# Patient Record
Sex: Male | Born: 1990 | Race: White | Hispanic: No | Marital: Single | Smoking: Never smoker
Health system: Southern US, Community
[De-identification: ages and names within clinical notes are randomized; demographics above are authoritative.]

## PROBLEM LIST (undated history)

## (undated) DIAGNOSIS — F419 Anxiety disorder, unspecified: Secondary | ICD-10-CM

## (undated) DIAGNOSIS — F909 Attention-deficit hyperactivity disorder, unspecified type: Secondary | ICD-10-CM

## (undated) HISTORY — DX: Attention-deficit hyperactivity disorder, unspecified type: F90.9

---
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
## 2016-01-31 DIAGNOSIS — M25512 Pain in left shoulder: Secondary | ICD-10-CM | POA: Diagnosis not present

## 2017-01-16 DIAGNOSIS — L309 Dermatitis, unspecified: Secondary | ICD-10-CM | POA: Diagnosis not present

## 2017-01-18 DIAGNOSIS — L309 Dermatitis, unspecified: Secondary | ICD-10-CM | POA: Diagnosis not present

## 2017-01-30 DIAGNOSIS — L309 Dermatitis, unspecified: Secondary | ICD-10-CM | POA: Diagnosis not present

## 2017-02-05 DIAGNOSIS — D2261 Melanocytic nevi of right upper limb, including shoulder: Secondary | ICD-10-CM | POA: Diagnosis not present

## 2017-02-05 DIAGNOSIS — D2262 Melanocytic nevi of left upper limb, including shoulder: Secondary | ICD-10-CM | POA: Diagnosis not present

## 2017-05-14 DIAGNOSIS — J302 Other seasonal allergic rhinitis: Secondary | ICD-10-CM | POA: Diagnosis not present

## 2017-07-19 ENCOUNTER — Ambulatory Visit
Admission: RE | Admit: 2017-07-19 | Discharge: 2017-07-19 | Disposition: A | Discharge status: Home / Self Care | Payer: Worker's Compensation | Source: Ambulatory Visit | Attending: Nurse Practitioner | Admitting: Nurse Practitioner

## 2017-07-19 ENCOUNTER — Other Ambulatory Visit: Payer: Self-pay | Admitting: Nurse Practitioner

## 2017-07-19 DIAGNOSIS — M545 Low back pain, unspecified: Secondary | ICD-10-CM

## 2017-10-19 DIAGNOSIS — R05 Cough: Secondary | ICD-10-CM | POA: Diagnosis not present

## 2020-05-21 ENCOUNTER — Ambulatory Visit: Payer: Self-pay

## 2020-05-21 ENCOUNTER — Encounter (INDEPENDENT_AMBULATORY_CARE_PROVIDER_SITE_OTHER): Payer: Self-pay

## 2020-05-21 ENCOUNTER — Ambulatory Visit: Payer: 59 | Admitting: Internal Medicine

## 2020-05-21 ENCOUNTER — Encounter: Payer: Self-pay | Admitting: Internal Medicine

## 2020-05-21 ENCOUNTER — Other Ambulatory Visit: Payer: Self-pay

## 2020-05-21 VITALS — BP 130/85 | HR 120 | Ht 67.5 in | Wt 151.0 lb

## 2020-05-21 DIAGNOSIS — G8929 Other chronic pain: Secondary | ICD-10-CM

## 2020-05-21 DIAGNOSIS — M2550 Pain in unspecified joint: Secondary | ICD-10-CM | POA: Diagnosis not present

## 2020-05-21 DIAGNOSIS — R7982 Elevated C-reactive protein (CRP): Secondary | ICD-10-CM | POA: Diagnosis not present

## 2020-05-21 DIAGNOSIS — M79642 Pain in left hand: Secondary | ICD-10-CM | POA: Diagnosis not present

## 2020-05-21 DIAGNOSIS — M5459 Other low back pain: Secondary | ICD-10-CM

## 2020-05-21 DIAGNOSIS — M545 Low back pain, unspecified: Secondary | ICD-10-CM | POA: Insufficient documentation

## 2020-05-21 NOTE — Progress Notes (Signed)
Office Visit Note  Patient: Marcus Cox             Date of Birth: 04/23/1990           MRN: [id number]             PCP: Dorena Cookey, MD (Inactive) Referring: Carolee Rota, NP Visit Date: 05/21/2020 Occupation: Road Production designer, theatre/television/film work  Subjective:  New Patient (Initial Visit) (Patient complains of generalized all over pain and weakness. )  History of Present Illness: RACIEL Cox is a 30 y.o. male here for evaluation of joint pains and elevated CRP level also concern for fibromyalgia. He has chronic pain diffusely and severe fatigue and feeling of general weakness since around his senior year of highschool. He does not recall any preceding medical events or changes or any joint injuries associated with this.  He does not typically notice any specific or focal joint swelling or inflammation changes but usually more generalized symptoms.  He does have an area on the right low back where he often has tenderness and chronic pain that is frequently worse when lying flat or rising from a prolonged seated position.  He has also experienced some pain in the heels and describes at least one episode of radiation of symptoms of the whole right leg.  He had previous x-ray of the lumbar spine in 2019 that was unrevealing.  He occasionally uses over-the-counter Tylenol or ibuprofen but no long-term treatment for symptoms.  Labs reviewed ANA neg ESR 4 CRP 33  Activities of Daily Living:  Patient reports morning stiffness for 24 hours.   Patient Reports nocturnal pain.  Difficulty dressing/grooming: Denies Difficulty climbing stairs: Reports Difficulty getting out of chair: Reports Difficulty using hands for taps, buttons, cutlery, and/or writing: Denies  Review of Systems  Constitutional: Positive for fatigue.  HENT: Positive for nose dryness. Negative for mouth sores and mouth dryness.   Eyes: Positive for pain, itching and visual disturbance. Negative for dryness.   Respiratory: Positive for shortness of breath and difficulty breathing. Negative for cough and hemoptysis.   Cardiovascular: Negative for chest pain, palpitations and swelling in legs/feet.  Gastrointestinal: Negative for abdominal pain, blood in stool, constipation and diarrhea.  Endocrine: Negative for increased urination.  Genitourinary: Negative for painful urination.  Musculoskeletal: Positive for arthralgias, joint pain, myalgias, muscle weakness, morning stiffness and myalgias. Negative for joint swelling and muscle tenderness.  Skin: Negative for color change, rash and redness.  Allergic/Immunologic: Negative for susceptible to infections.  Neurological: Positive for headaches, memory loss and weakness. Negative for dizziness and numbness.  Hematological: Negative for swollen glands.  Psychiatric/Behavioral: Positive for confusion and sleep disturbance.    PMFS History:  Patient Active Problem List   Diagnosis Date Noted  . Elevated C-reactive protein (CRP) 05/21/2020  . Chronic low back pain 05/21/2020  . Polyarthralgia 05/21/2020  . ATTENTION DEFICIT HYPERACTIVITY DISORDER 01/09/2008  . TONSILLITIS, ACUTE 01/09/2008    Past Medical History:  Diagnosis Date  . ADHD     History reviewed. No pertinent family history. History reviewed. No pertinent surgical history. Social History   Social History Narrative  . Not on file    There is no immunization history on file for this patient.   Objective: Vital Signs: BP 130/85 (BP Location: Right Arm, Patient Position: Sitting, Cuff Size: Normal)   Pulse (!) 120   Ht 5' 7.5" (1.715 m)   Wt 151 lb (68.5 kg)   BMI 23.30 kg/m  Physical Exam HENT:     Right Ear: External ear normal.     Left Ear: External ear normal.     Mouth/Throat:     Mouth: Mucous membranes are moist.     Pharynx: Oropharynx is clear.  Eyes:     Conjunctiva/sclera: Conjunctivae normal.  Cardiovascular:     Rate and Rhythm: Regular rhythm.  Tachycardia present.  Pulmonary:     Effort: Pulmonary effort is normal.     Breath sounds: Normal breath sounds.  Skin:    General: Skin is warm and dry.     Findings: No rash.  Neurological:     General: No focal deficit present.     Mental Status: He is alert.  Psychiatric:        Mood and Affect: Mood normal.    Musculoskeletal Exam:  Neck full ROM no tenderness Shoulders full ROM no tenderness or swelling Elbows full ROM no tenderness or swelling Wrists no tenderness or swelling, left wrist slightly decreased radial deviation Fingers full ROM no tenderness or swelling Right sided paraspinal tenderness to palpation around iliac crest Hip normal internal and external rotation, right sided posterior hip and back pain with Pace maneuver, left normal Knees full ROM no tenderness or swelling Ankles full ROM no tenderness or swelling MTPs full ROM no tenderness or swelling   Investigation: No additional findings.  Imaging: XR Hand 2 View Left  Result Date: 05/21/2020 X-ray left hand 2 views Radiocarpal joint space appears normal.  Carpal bones appear normal.  MCP PIP and DIP joints appear normal.  Bone mineralization is normal. Impression Normal appearing hand x-ray  XR Pelvis 1-2 Views  Result Date: 05/21/2020 X-ray pelvis 2 views AP and Ferguson Femoral acetabular joint spaces appear normal bilaterally.  Probable cystic change at the femoral neck.  SI joints are patent bilaterally with no erosions or significant sclerotic changes.  Visualized portion of the lumbar spine appears normal except some mild dextroscoliosis. Impression Normal pelvis xray without significant changes   Recent Labs: No results found for: WBC, HGB, PLT, NA, K, CL, CO2, GLUCOSE, BUN, CREATININE, BILITOT, ALKPHOS, AST, ALT, PROT, ALBUMIN, CALCIUM, GFRAA, QFTBGOLD, QFTBGOLDPLUS  Speciality Comments: No specialty comments available.  Procedures:  No procedures performed Allergies: Patient has no known  allergies.   Assessment / Plan:     Visit Diagnoses: Polyarthralgia - Plan: HLA-B27 antigen, C-reactive protein, Rheumatoid factor, Cyclic citrul peptide antibody, IgG  He describes pretty diffuse pains not very well localized over specific joints this could be consistent with fibromyalgia or chronic pain syndrome.  However he does describe pretty consistent low back pain problems.  Also with a significantly elevated CRP not otherwise explained at this time.  We will repeat inflammatory markers also checking RF, CCP, HLA-B27 test.  I believe we would also need to exclude axial spondyloarthropathy before attributing all of this problem to fibromyalgia, chronic pain sensitization, or myofascial problems.  Elevated C-reactive protein (CRP)  No obvious inflammatory changes are present on exam today.  Repeating inflammatory markers today.  Chronic right-sided low back pain, unspecified whether sciatica present  He has chronic low back pain and at least one episode of what could be radicular symptoms in the right leg.  However overall from his history is most want to exclude any inflammatory sacroiliitis type changes.  Obtaining x-ray pelvis and left hand today.  If these are nonspecific but still has evidence of inflammatory process would recommend MRI evaluation.   Orders: Orders Placed This Encounter  Procedures  . XR Pelvis 1-2 Views  . XR Hand 2 View Left  . HLA-B27 antigen  . C-reactive protein  . Rheumatoid factor  . Cyclic citrul peptide antibody, IgG   No orders of the defined types were placed in this encounter.    Follow-Up Instructions: No follow-ups on file.   Collier Salina, MD  Note - This record has been created using [employer].  Chart creation errors have been sought, but may not always  have been located. Such creation errors do not reflect on  the standard of medical care.

## 2020-05-21 NOTE — Patient Instructions (Addendum)
I recommend checking out the [facility] patient-centered guide for fibromyalgia and chronic pain management: [employer]   C-Reactive Protein Test Why am I having this test? The C-reactive protein (CRP) is a substance that the liver releases in response to inflammation within the body. You may have a CRP test to help diagnose:  Serious bacterial or fungal infections.  Inflammatory diseases, such as inflammatory bowel disease.  Lupus, rheumatoid arthritis, or other autoimmune diseases. What is being tested? This test checks for the level of CRP in your blood. The level of CRP in your body increases greatly after a heart attack, infection, or injury. What kind of sample is taken? A blood sample is required for this test. It is usually collected by inserting a needle into a blood vessel.   Tell a health care provider about:  All medicines you are taking, including vitamins, herbs, eye drops, creams, and over-the-counter medicines.  Any medical conditions you have.  The use of birth control pills or hormone replacement therapy such as estrogen.  Any blood disorders you have.  Whether you are pregnant or may be pregnant. How are the results reported? Your test results will be reported as a value that indicates how much CRP is in your blood. This will be reported as milligrams of CRP per liter (mg/L) of blood. Your health care provider will compare your results to normal ranges that were established after testing a large group of people (reference ranges). Reference ranges may vary among labs and hospitals. For this test, the standard CRP reference value is less than 10 mg/L. What do the results mean? A standard CRP test result that is less than 10 mg/L is considered normal, meaning that you do not have an abnormally high level of inflammation in your body. A standard CRP test result that is greater than 10 mg/L means that there is an abnormally high level of  inflammation in your body that is causing CRP to be released. Inflammation may result from many different conditions or injuries. You will have more tests to help make a diagnosis. Talk with your health care provider about what your results mean. Questions to ask your health care provider Ask your health care provider, or the department that is doing the test:  When will my results be ready?  How will I get my results?  What are my treatment options?  What other tests do I need?  What are my next steps? Summary  C-reactive protein (CPR) is a substance released by the liver in response to inflammation within the body.  The CRP test may be performed to help diagnose infection and inflammatory conditions.  Talk with your health care provider about what your results mean.   Rheumatoid Factor Test Why am I having this test? The rheumatoid factor test is used to help diagnose certain autoimmune diseases. Normally, your body makes protective proteins called antibodies (IgM, IgG, and IgA) to help fight off infections. If you have an autoimmune disease, your body may make a collection of antibodies that do not function correctly (autoantibodies). They attack tissues that are wrongly identified as foreign. In some autoimmune diseases, these autoantibodies are known as the rheumatoid factor (RF). You may have this test if your health care provider suspects that you have an autoimmune disease, such as:  Rheumatoid arthritis (RA).  Systemic lupus erythematosus (SLE).  Sjgren's syndrome.  Mixed connective tissue disease. A majority of people who have rheumatoid arthritis have a positive rheumatoid factor. Raised levels of  these autoantibodies can also sometimes be a sign of other autoimmune diseases. However, it is also possible for the RF test to be negative even when a disease is present. Likewise, a small number of people may have a positive RF test when an autoimmune disease is not actually  present. Other tests may be needed to help make a diagnosis. What is being tested? This test checks your blood for the RF autoantibodies. What kind of sample is taken? A blood sample is required for this test. It is usually collected by inserting a needle into a blood vessel or by sticking a finger with a small needle.   How are the results reported? Your test result will be reported as either positive or negative for RF autoantibodies. What do the results mean? A negative result means that no RF or only a small amount was found in your blood. This means that it is unlikely that you have an autoimmune disease. A positive result means that a larger amount of RF autoantibodies was found in your blood. This may indicate that you have RA or another autoimmune disease. Your health care provider will talk to you about doing more tests to confirm your results. Talk with your health care provider about what your results mean. Questions to ask your health care provider Ask your health care provider, or the department that is doing the test:  When will my results be ready?  How will I get my results?  What are my treatment options?  What other tests do I need?  What are my next steps? Summary  The rheumatoid factor test is used to help diagnose certain autoimmune diseases.  In some autoimmune diseases, the body makes autoantibodies known as the rheumatoid factor (RF), which attack tissues that are wrongly identified as foreign.  A negative result means that no RF or only a small amount was found in your blood.  A positive result means that a larger amount of RF autoantibodies was found in your blood.  Talk with your health care provider about what your results mean.   Human Leukocyte Antigen B27 Test Why am I having this test? The human leukocyte antigen B27 (HLA-B27) test is performed to help your health care provider diagnose certain autoimmune diseases, especially an autoimmune disease  called ankylosing spondylitis. Autoimmune diseases are caused by an abnormality in your body's defense (immune) system. Normally, your immune system attacks germs and other things that can make you sick. When you have an autoimmune disease, your immune system attacks the normal cells of your body instead. Ankylosing spondylitis causes long-term (chronic) pain and stiffness in the spine and hips and can also affect other parts of the body, such as the eyes and gastrointestinal tract. What is being tested? This test checks your blood for the presence of HLA-B27, which is a protein found on the surface of white blood cells. HLA-B27 is commonly found in people who have ankylosing spondylitis. What kind of sample is taken? A blood sample is required for this test. It is usually collected by inserting a needle into a blood vessel.   How are the results reported? Your test results will be reported as either positive or negative for the presence of HLA-B27 in your blood. What do the results mean?  A negative result means that you are less likely to have ankylosing spondylitis or certain other autoimmune diseases. However, having a negative result does not mean that you do not have one of these diseases.  A  positive result means that you are more likely to have: ? Ankylosing spondylitis. ? Other autoimmune diseases, such as:  Rheumatoid arthritis.  Reactive arthritis.  Psoriasis or psoriatic arthritis.  Inflammation of the eyes (uveitis).  Inflammatory bowel disease. HLA-B27 is normally found in a small number of people without a disease. Having HLA-B27 does not always mean that you have an autoimmune disease. Your health care provider will use these test results along with other test results and your medical history to make a diagnosis. Talk with your health care provider about what your results mean. Questions to ask your health care provider Ask your health care provider, or the department  that is doing the test:  When will my results be ready?  How will I get my results?  What are my treatment options?  What other tests do I need?  What are my next steps? Summary  The human leukocyte antigen B27 (HLA-B27) test is performed to help your health care provider diagnose a condition called ankylosing spondylitis.  Ankylosing spondylitis causes long-term (chronic) pain and stiffness in the spine and hips and can also affect other parts of the body, such as the eyes and gastrointestinal tract.  A negative result means that you are less likely to have ankylosing spondylitis, while a positive result means that you are more likely to have this condition or another autoimmune condition.  Talk with your health care provider about what your results mean.

## 2020-05-21 NOTE — Progress Notes (Signed)
Xrays do not show any significant abnormalities that would explain chronic back pain symptoms or indicate an inflammatory disease. If his other workup still suggests ongoing inflammation I would recommend pursuing MRI to exclude a problem in the low back and pelvis joints.

## 2020-05-24 LAB — RHEUMATOID FACTOR: Rheumatoid fact SerPl-aCnc: <14 IU/mL (ref <14)

## 2020-05-24 LAB — CYCLIC CITRUL PEPTIDE ANTIBODY, IGG: Cyclic Citrullin Peptide Ab: <16 UNITS

## 2020-05-24 LAB — C-REACTIVE PROTEIN: CRP: 28.6 mg/L — ABNORMAL HIGH (ref <8.0)

## 2020-05-24 LAB — HLA-B27 ANTIGEN: HLA-B27 Antigen: NEGATIVE

## 2020-05-25 IMAGING — CR DG LUMBAR SPINE COMPLETE 4+V
5 series · 5 of 5 positions shown · non-contrast
Comparison: None.

CLINICAL DATA: Low back pain, recent injury.

EXAM:
LUMBAR SPINE - COMPLETE 4+ VIEW

[w lumbar spine ap]
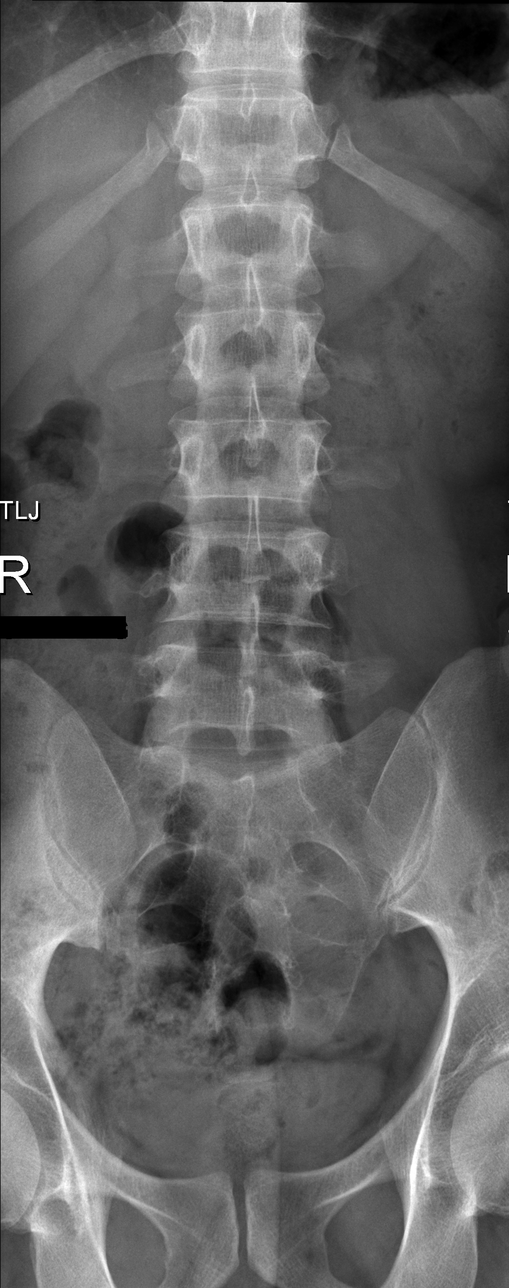

[w lumbar spine obl (1 of 2)]
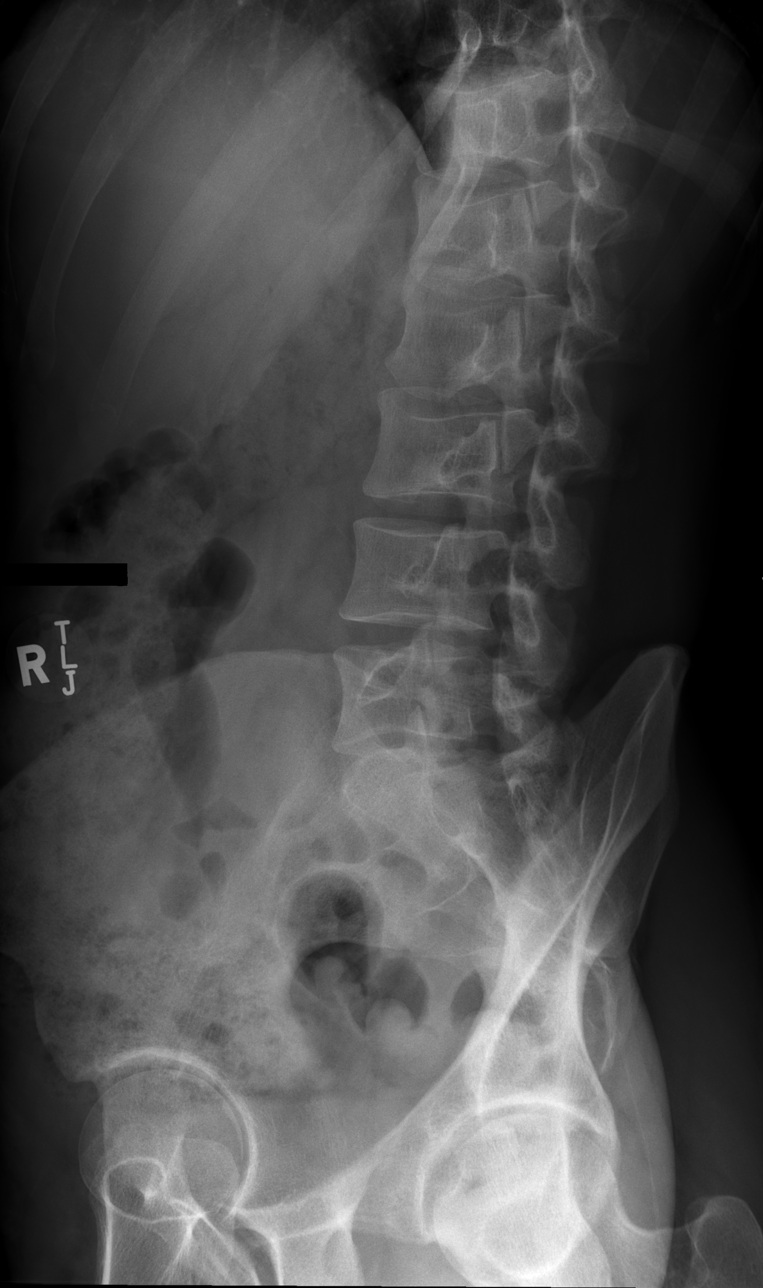

[w lumbar spine obl (2 of 2)]
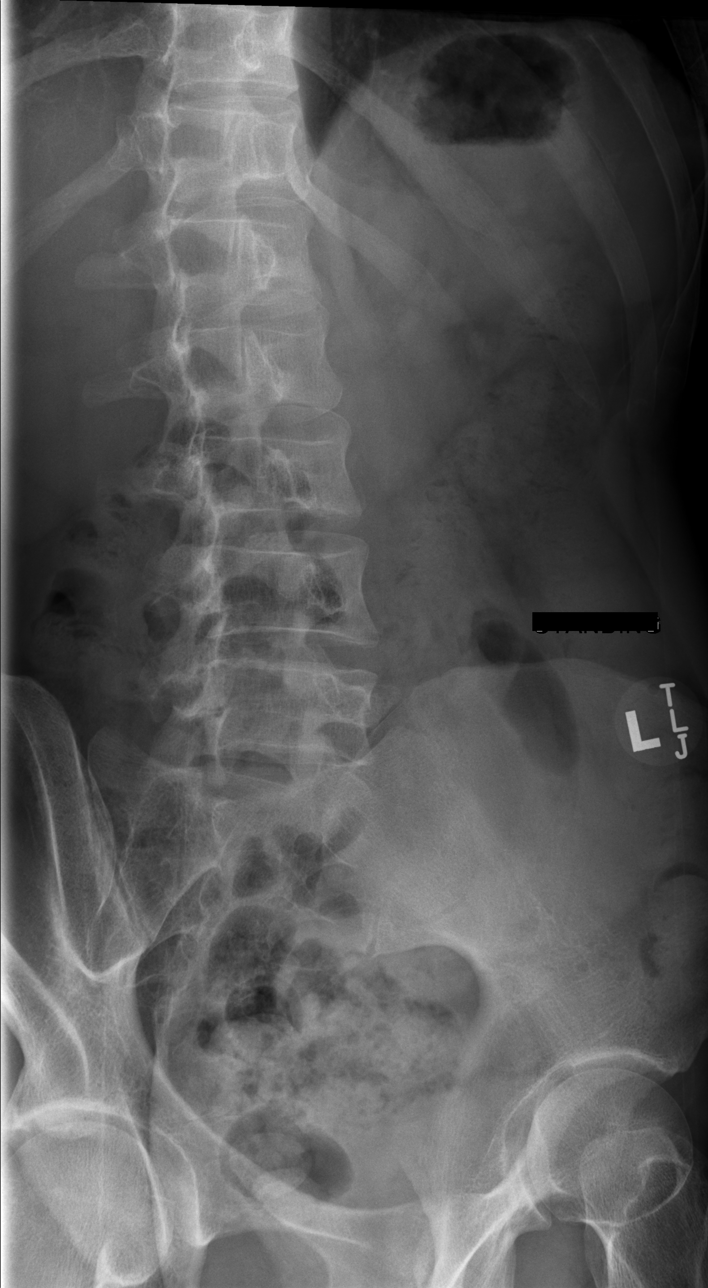

[w lumbar spine lat]
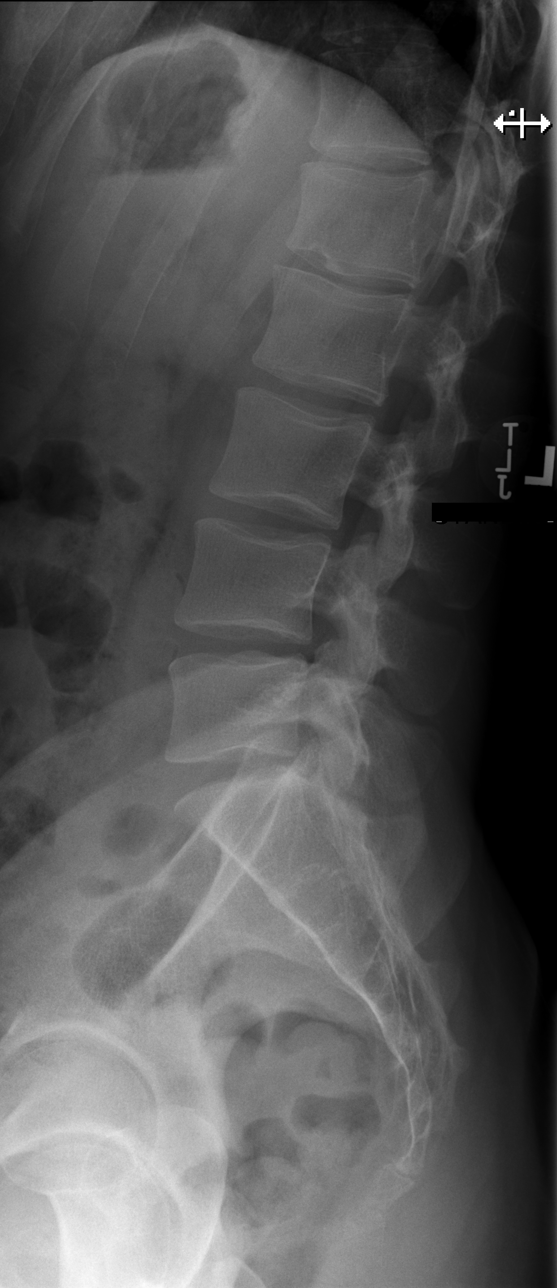

[w lumbar l-5 s-1 spot]
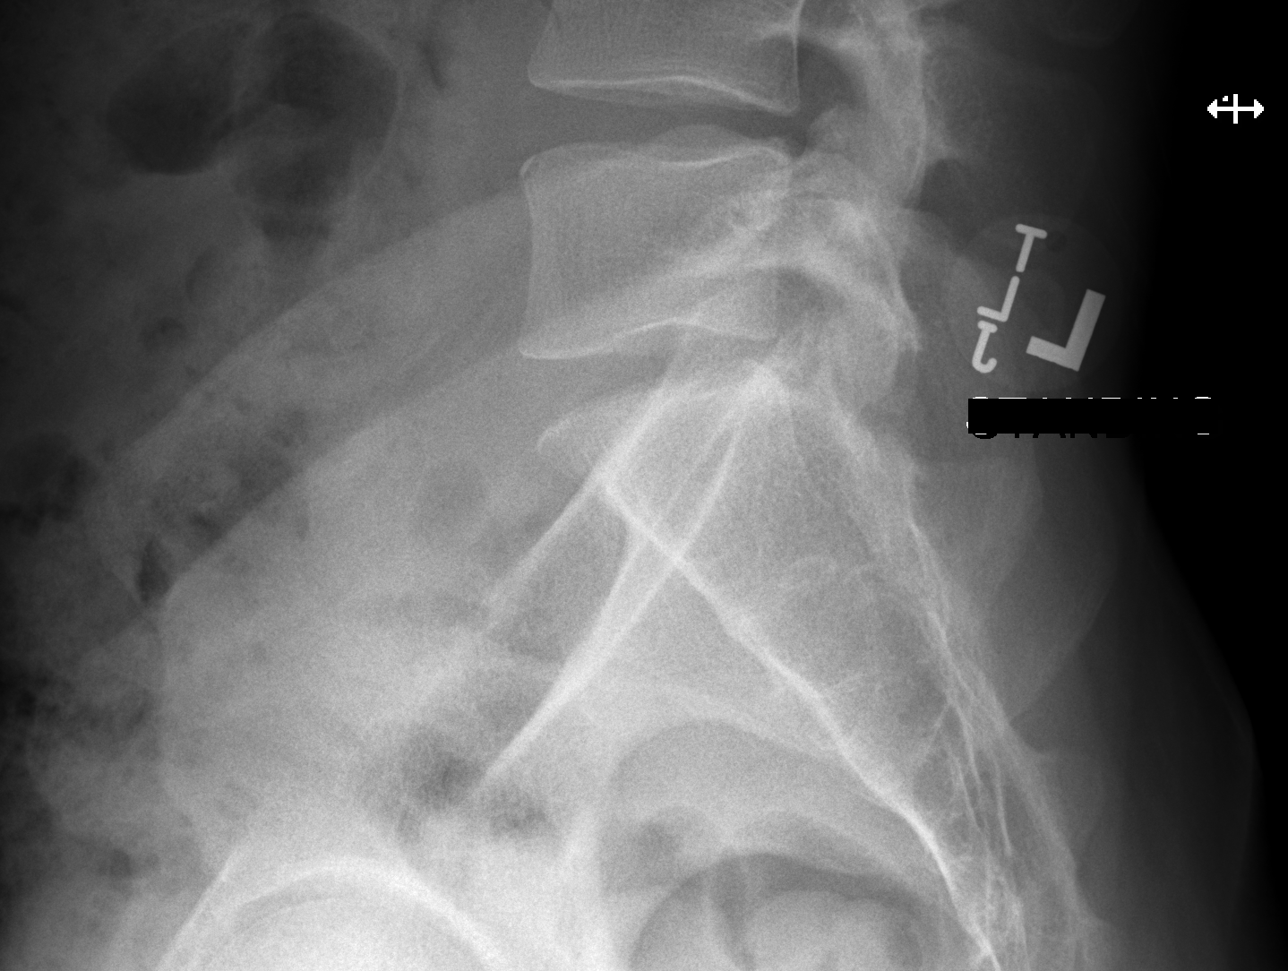

[5 of 5 positions shown; findings below may reference images not displayed]

FINDINGS: There is no evidence of lumbar spine fracture. Alignment is normal.
Intervertebral disc spaces are maintained. Visualized paravertebral
soft tissues are unremarkable.
IMPRESSION: Negative.

## 2021-08-04 ENCOUNTER — Other Ambulatory Visit (HOSPITAL_BASED_OUTPATIENT_CLINIC_OR_DEPARTMENT_OTHER): Payer: Self-pay

## 2021-08-04 ENCOUNTER — Other Ambulatory Visit: Payer: Self-pay

## 2021-08-04 ENCOUNTER — Encounter (HOSPITAL_BASED_OUTPATIENT_CLINIC_OR_DEPARTMENT_OTHER): Payer: Self-pay | Admitting: Emergency Medicine

## 2021-08-04 ENCOUNTER — Emergency Department (HOSPITAL_BASED_OUTPATIENT_CLINIC_OR_DEPARTMENT_OTHER)
Admission: EM | Admit: 2021-08-04 | Discharge: 2021-08-04 | Disposition: A | Discharge status: Home / Self Care | Payer: No Typology Code available for payment source | Attending: Emergency Medicine | Admitting: Emergency Medicine

## 2021-08-04 DIAGNOSIS — S0501XA Injury of conjunctiva and corneal abrasion without foreign body, right eye, initial encounter: Secondary | ICD-10-CM

## 2021-08-04 DIAGNOSIS — S0591XA Unspecified injury of right eye and orbit, initial encounter: Secondary | ICD-10-CM | POA: Diagnosis present

## 2021-08-04 DIAGNOSIS — X58XXXA Exposure to other specified factors, initial encounter: Secondary | ICD-10-CM | POA: Diagnosis not present

## 2021-08-04 MED ORDER — POLYMYXIN B-TRIMETHOPRIM 10000-0.1 UNIT/ML-% OP SOLN
1.0000 [drp] | OPHTHALMIC | 0 refills | Status: AC
  Filled 2021-08-04: qty 10, 7d supply, fill #0

## 2021-08-04 MED ORDER — FLUORESCEIN SODIUM 1 MG OP STRP
1.0000 | ORAL_STRIP | Freq: Once | OPHTHALMIC | Status: AC
  Administered 2021-08-04: 1 via OPHTHALMIC
  Filled 2021-08-04: qty 1

## 2021-08-04 MED ORDER — TETRACAINE HCL 0.5 % OP SOLN
2.0000 [drp] | Freq: Once | OPHTHALMIC | Status: AC
  Administered 2021-08-04: 2 [drp] via OPHTHALMIC
  Filled 2021-08-04: qty 4

## 2021-08-04 NOTE — ED Triage Notes (Signed)
Pt here from home with c/o right eye pain , feels like something is in his eye , no blurred vision

## 2021-08-04 NOTE — ED Provider Notes (Signed)
[facility] Provider Note   CSN: [id number] Arrival date & time: 08/04/21  0254     History  Chief Complaint  Patient presents with   Eye Injury    Marcus Cox is a 31 y.o. male.  Patient works for [employer].  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  Was seen at the clinic today and referred here.  Has foreign body sensation to right eye but no visual changes.  No blurry vision, spots in vision or black spots.  No fever or drainage.  Does not use glasses or contacts.  Tetanus shot is up-to-date.  The history is provided by the patient.  Eye Injury      Home Medications Prior to Admission medications   Medication Sig Start Date End Date Taking? Authorizing Provider  ALPRAZolam (XANAX PO) Take by mouth as needed.    [provider]  amphetamine-dextroamphetamine (ADDERALL) 30 MG tablet Take 1 tablet by mouth as needed. 05/17/20   [provider]  escitalopram (LEXAPRO) 10 MG tablet Take 1 tablet by mouth daily. 05/17/20   [provider]      Allergies    Patient has no known allergies.    Review of Systems   Review of Systems  Physical Exam Updated Vital Signs BP (!) 144/90   Pulse 85   Temp 98.3 F (36.8 C) (Oral)   Resp 18   Ht 5' 7.5" (1.715 m)   Wt 68.5 kg   SpO2 100%   BMI 23.30 kg/m  Physical Exam Vitals and nursing note reviewed.  Constitutional:      General: He is not in acute distress.    Appearance: He is well-developed.  HENT:     Head: Normocephalic and atraumatic.     Mouth/Throat:     Pharynx: No oropharyngeal exudate.  Eyes:     General: Lids are normal. Lids are everted, no foreign bodies appreciated. Vision grossly intact.        Right eye: No foreign body.     Intraocular pressure: Right eye pressure is 22 mmHg.     Extraocular Movements:     Right eye: Normal extraocular motion.     Conjunctiva/sclera:  Conjunctivae normal.     Pupils: Pupils are equal, round, and reactive to light.     Right eye: Corneal abrasion and fluorescein uptake present. Seidel exam negative.     Slit lamp exam:    Right eye: No hypopyon or anterior chamber bulge.   Neck:     Comments: No meningismus. Cardiovascular:     Rate and Rhythm: Normal rate and regular rhythm.     Heart sounds: Normal heart sounds. No murmur heard. Pulmonary:     Effort: Pulmonary effort is normal. No respiratory distress.     Breath sounds: Normal breath sounds.  Abdominal:     Palpations: Abdomen is soft.     Tenderness: There is no abdominal tenderness. There is no guarding or rebound.  Musculoskeletal:        General: No tenderness. Normal range of motion.     Cervical back: Normal range of motion and neck supple.  Skin:    General: Skin is warm.  Neurological:     Mental Status: He is alert and oriented to person, place, and time.     Cranial Nerves: No cranial nerve deficit.     Motor: No abnormal muscle tone.  Coordination: Coordination normal.     Comments:  5/5 strength throughout. CN 2-12 intact.Equal grip strength.   Psychiatric:        Behavior: Behavior normal.    ED Results / Procedures / Treatments   Labs (all labs ordered are listed, but only abnormal results are displayed) Labs Reviewed - No data to display  EKG None  Radiology No results found.  Procedures Procedures    Medications Ordered in ED Medications  fluorescein ophthalmic strip 1 strip (1 strip Both Eyes Given 08/04/21 0944)  tetracaine (PONTOCAINE) 0.5 % ophthalmic solution 2 drop (2 drops Both Eyes Given 08/04/21 0944)    ED Course/ Medical Decision Making/ A&P                           Medical Decision Making Amount and/or Complexity of Data Reviewed Labs: ordered. Decision-making details documented in ED Course. Radiology: ordered and independent interpretation performed. Decision-making details documented in ED  Course. ECG/medicine tests: ordered and independent interpretation performed. Decision-making details documented in ED Course.  Risk Prescription drug management.  Foreign body sensation to right eye.  Vision intact.  Lids everted and swept, no foreign body seen.  Small corneal abrasion at 6:00 7 o'clock position.  Likely source of his discomfort  Visual Acuity Bilateral Distance: 20/10 R Distance: 20/10 L Distance: 20/10  Visual acuity is normal.  Tetanus is up-to-date.  Patient given topical antibiotics for suspected corneal abrasion and foreign body sensation.  Follow-up with ophthalmology.  Return precautions discussed        Final Clinical Impression(s) / ED Diagnoses Final diagnoses:  None    Rx / DC Orders ED Discharge Orders     None         Ronnie Doo, Annie Main, MD 08/04/21 1043

## 2021-08-04 NOTE — Discharge Instructions (Signed)
Use antibiotic drops as prescribed.  Follow-up with the eye doctor for an appointment this week.  Return to the ED with worsening pain, drainage, fever, visual changes or other concerns.

## 2022-02-28 ENCOUNTER — Other Ambulatory Visit: Payer: Self-pay | Admitting: Family Medicine

## 2022-02-28 DIAGNOSIS — K529 Noninfective gastroenteritis and colitis, unspecified: Secondary | ICD-10-CM

## 2022-03-28 ENCOUNTER — Ambulatory Visit
Admission: RE | Admit: 2022-03-28 | Discharge: 2022-03-28 | Disposition: A | Discharge status: Home / Self Care | Payer: 59 | Source: Ambulatory Visit | Attending: Family Medicine | Admitting: Family Medicine

## 2022-03-28 DIAGNOSIS — K529 Noninfective gastroenteritis and colitis, unspecified: Secondary | ICD-10-CM

## 2023-08-20 ENCOUNTER — Encounter (HOSPITAL_BASED_OUTPATIENT_CLINIC_OR_DEPARTMENT_OTHER): Payer: Self-pay | Admitting: *Deleted

## 2023-08-20 ENCOUNTER — Emergency Department (HOSPITAL_BASED_OUTPATIENT_CLINIC_OR_DEPARTMENT_OTHER)
Admission: EM | Admit: 2023-08-20 | Discharge: 2023-08-20 | Disposition: A | Discharge status: Home / Self Care | Attending: Emergency Medicine | Admitting: Emergency Medicine

## 2023-08-20 ENCOUNTER — Other Ambulatory Visit: Payer: Self-pay

## 2023-08-20 ENCOUNTER — Emergency Department (HOSPITAL_BASED_OUTPATIENT_CLINIC_OR_DEPARTMENT_OTHER)

## 2023-08-20 DIAGNOSIS — R590 Localized enlarged lymph nodes: Secondary | ICD-10-CM | POA: Diagnosis not present

## 2023-08-20 DIAGNOSIS — J039 Acute tonsillitis, unspecified: Secondary | ICD-10-CM | POA: Diagnosis not present

## 2023-08-20 DIAGNOSIS — J029 Acute pharyngitis, unspecified: Secondary | ICD-10-CM

## 2023-08-20 HISTORY — DX: Anxiety disorder, unspecified: F41.9

## 2023-08-20 LAB — COMPREHENSIVE METABOLIC PANEL WITH GFR
ALT: 15 U/L (ref 0–44)
AST: 20 U/L (ref 15–41)
Albumin: 4.3 g/dL (ref 3.5–5.0)
Alkaline Phosphatase: 91 U/L (ref 38–126)
Anion gap: 12 (ref 5–15)
BUN: 13 mg/dL (ref 6–20)
CO2: 25 mmol/L (ref 22–32)
Calcium: 9.7 mg/dL (ref 8.9–10.3)
Chloride: 103 mmol/L (ref 98–111)
Creatinine, Ser: 1.19 mg/dL (ref 0.61–1.24)
GFR, Estimated: >60 mL/min (ref >60)
Glucose, Bld: 96 mg/dL (ref 70–99)
Potassium: 4.2 mmol/L (ref 3.5–5.1)
Sodium: 139 mmol/L (ref 135–145)
Total Bilirubin: 0.7 mg/dL (ref 0.0–1.2)
Total Protein: 7.3 g/dL (ref 6.5–8.1)

## 2023-08-20 LAB — CBC WITH DIFFERENTIAL/PLATELET
Abs Immature Granulocytes: 0.04 K/uL (ref 0.00–0.07)
Basophils Absolute: 0 K/uL (ref 0.0–0.1)
Basophils Relative: 0 %
Eosinophils Absolute: 0 K/uL (ref 0.0–0.5)
Eosinophils Relative: 0 %
HCT: 44.8 % (ref 39.0–52.0)
Hemoglobin: 16 g/dL (ref 13.0–17.0)
Immature Granulocytes: 0 %
Lymphocytes Relative: 13 %
Lymphs Abs: 1.2 K/uL (ref 0.7–4.0)
MCH: 30.3 pg (ref 26.0–34.0)
MCHC: 35.7 g/dL (ref 30.0–36.0)
MCV: 84.8 fL (ref 80.0–100.0)
Monocytes Absolute: 1 K/uL (ref 0.1–1.0)
Monocytes Relative: 10 %
Neutro Abs: 7.4 K/uL (ref 1.7–7.7)
Neutrophils Relative %: 77 %
Platelets: 154 K/uL (ref 150–400)
RBC: 5.28 MIL/uL (ref 4.22–5.81)
RDW: 11.6 % (ref 11.5–15.5)
WBC: 9.7 K/uL (ref 4.0–10.5)
nRBC: 0 % (ref 0.0–0.2)

## 2023-08-20 LAB — MONONUCLEOSIS SCREEN: Mono Screen: NEGATIVE

## 2023-08-20 LAB — RESP PANEL BY RT-PCR (RSV, FLU A&B, COVID)  RVPGX2
Influenza A by PCR: NEGATIVE
Influenza B by PCR: NEGATIVE
Resp Syncytial Virus by PCR: NEGATIVE
SARS Coronavirus 2 by RT PCR: NEGATIVE

## 2023-08-20 LAB — GROUP A STREP BY PCR: Group A Strep by PCR: NOT DETECTED

## 2023-08-20 MED ORDER — KETOROLAC TROMETHAMINE 30 MG/ML IJ SOLN
15.0000 mg | Freq: Once | INTRAMUSCULAR | Status: AC
  Administered 2023-08-20 (×2): 15 mg via INTRAVENOUS
  Filled 2023-08-20: qty 1

## 2023-08-20 MED ORDER — ACETAMINOPHEN 325 MG PO TABS
650.0000 mg | ORAL_TABLET | Freq: Once | ORAL | Status: AC | PRN
  Administered 2023-08-20 (×2): 650 mg via ORAL
  Filled 2023-08-20: qty 2

## 2023-08-20 MED ORDER — IOHEXOL 300 MG/ML  SOLN
100.0000 mL | Freq: Once | INTRAMUSCULAR | Status: AC | PRN
  Administered 2023-08-20 (×2): 76 mL via INTRAVENOUS

## 2023-08-20 MED ORDER — CLINDAMYCIN HCL 300 MG PO CAPS: 300.0000 mg | ORAL_CAPSULE | Freq: Three times a day (TID) | ORAL | 0 refills | Status: AC

## 2023-08-20 MED ORDER — DEXAMETHASONE SODIUM PHOSPHATE 10 MG/ML IJ SOLN
10.0000 mg | Freq: Once | INTRAMUSCULAR | Status: AC
  Administered 2023-08-20 (×2): 10 mg via INTRAVENOUS
  Filled 2023-08-20: qty 1

## 2023-08-20 MED ORDER — LACTATED RINGERS IV BOLUS
1000.0000 mL | Freq: Once | INTRAVENOUS | Status: AC
  Administered 2023-08-20 (×2): 1000 mL via INTRAVENOUS

## 2023-08-20 MED ORDER — PREDNISONE 50 MG PO TABS: ORAL_TABLET | ORAL | 0 refills | Status: AC

## 2023-08-20 MED ORDER — CLINDAMYCIN PHOSPHATE 600 MG/50ML IV SOLN
600.0000 mg | Freq: Once | INTRAVENOUS | Status: AC
  Administered 2023-08-20 (×2): 600 mg via INTRAVENOUS
  Filled 2023-08-20: qty 50

## 2023-08-20 NOTE — ED Triage Notes (Addendum)
 C/o sore throat on the right side that started 2 days ago. States that his right tonsil is hurting. Temp yesterday was 102.3. Last dose of tylenol  at 2300 last night. C/o body aches, headache, denies any n/v. Right tonsil swollen on exam, but denies any sob. No airway occlusion. Denies any cough or congestion.

## 2023-08-20 NOTE — ED Provider Notes (Signed)
 [place] EMERGENCY DEPARTMENT AT [facility] Provider Note   CSN: [id number] Arrival date & time: 08/20/23  0435     Patient presents with: Sore Throat   Marcus Cox is a 33 y.o. male.   2 days of bodyaches, chills, fever to 102.  No significant cough.  Pain to the right side of his throat only with swallowing.  No pain to the left side of his throat.  No trouble breathing.  No trouble swallowing spit.  No cough or runny nose.  Eating and drinking normally.  No vomiting or diarrhea.  No chest pain or shortness of breath.  Pain mostly to his throat only with swallowing.  No abdominal pain.  No travel or sick contacts.  No recent other illness.  Last took antipyretics at 11 PM.  The history is provided by the patient.  Sore Throat Pertinent negatives include no chest pain, no abdominal pain and no headaches.       Prior to Admission medications   Medication Sig Start Date End Date Taking? Authorizing Provider  cyclobenzaprine (FLEXERIL) 10 MG tablet Take by mouth 3 (three) times daily as needed for muscle spasms.   Yes [provider]  ALPRAZolam (XANAX PO) Take by mouth as needed.    [provider]    Allergies: Patient has no known allergies.    Review of Systems  Constitutional:  Positive for fever. Negative for activity change and appetite change.  HENT:  Positive for sore throat and trouble swallowing.   Respiratory:  Negative for cough.   Cardiovascular:  Negative for chest pain.  Gastrointestinal:  Negative for abdominal pain, nausea and vomiting.  Genitourinary:  Negative for dysuria and hematuria.  Musculoskeletal:  Positive for arthralgias and myalgias.  Skin:  Negative for rash.  Neurological:  Positive for weakness. Negative for dizziness and headaches.   all other systems are negative except as noted in the HPI and PMH.    Updated Vital Signs BP 134/89   Pulse 98   Temp (!) 102.3 F (39.1 C) (Oral)   Resp 18   Ht 5'  9 (1.753 m)   Wt 65.8 kg   SpO2 98%   BMI 21.41 kg/m   Physical Exam Vitals and nursing note reviewed.  Constitutional:      General: He is not in acute distress.    Appearance: He is well-developed.     Comments: No distress, controlling secretions  HENT:     Head: Normocephalic and atraumatic.     Mouth/Throat:     Pharynx: Oropharyngeal exudate and posterior oropharyngeal erythema present.     Comments: Uvula midline.  Right tonsil enlarged compared to left.  Scant exudates. Eyes:     Conjunctiva/sclera: Conjunctivae normal.     Pupils: Pupils are equal, round, and reactive to light.  Neck:     Comments: No meningismus. Cardiovascular:     Rate and Rhythm: Normal rate and regular rhythm.     Heart sounds: Normal heart sounds. No murmur heard. Pulmonary:     Effort: Pulmonary effort is normal. No respiratory distress.     Breath sounds: Normal breath sounds.  Abdominal:     Palpations: Abdomen is soft.     Tenderness: There is no abdominal tenderness. There is no guarding or rebound.  Musculoskeletal:        General: No tenderness. Normal range of motion.     Cervical back: Normal range of motion and neck supple.  Lymphadenopathy:  Cervical: Cervical adenopathy present.  Skin:    General: Skin is warm.  Neurological:     Mental Status: He is alert and oriented to person, place, and time.     Cranial Nerves: No cranial nerve deficit.     Motor: No abnormal muscle tone.     Coordination: Coordination normal.     Comments:  5/5 strength throughout. CN 2-12 intact.Equal grip strength.   Psychiatric:        Behavior: Behavior normal.     (all labs ordered are listed, but only abnormal results are displayed) Labs Reviewed  GROUP A STREP BY PCR  RESP PANEL BY RT-PCR (RSV, FLU A&B, COVID)  [id number]  CBC WITH DIFFERENTIAL/PLATELET  COMPREHENSIVE METABOLIC PANEL WITH GFR  MONONUCLEOSIS SCREEN    EKG: None  Radiology: No results found.   Procedures    Medications Ordered in the ED  dexamethasone  (DECADRON ) injection 10 mg (has no administration in time range)  lactated ringers  bolus 1,000 mL (has no administration in time range)  ketorolac  (TORADOL ) 30 MG/ML injection 15 mg (has no administration in time range)  clindamycin  (CLEOCIN ) IVPB 600 mg (has no administration in time range)  acetaminophen  (TYLENOL ) tablet 650 mg (650 mg Oral Given 08/20/23 0506)                                    Medical Decision Making Amount and/or Complexity of Data Reviewed Labs: ordered. Decision-making details documented in ED Course. Radiology: ordered and independent interpretation performed. Decision-making details documented in ED Course. ECG/medicine tests: ordered and independent interpretation performed. Decision-making details documented in ED Course.  Risk OTC drugs. Prescription drug management.   2 days of bodyaches, sore throat, fever.  Airway intact.  No hypoxia or increased work of breathing.  Clear lungs.  Strep test in triage is negative but high suspicion for same.  Will evaluate for peritonsillar abscess with CT scan, check labs and Monospot.  IV fluids, antibiotics and Decadron  given  Resting comfortably on recheck.  Temperature improved to 98.8.  No leukocytosis.  Monospot is negative  Strep is negative. COVID swab is negative.  Awaiting CT scan at shift change to evaluate for peritonsillar abscess.  Anticipate discharge with antibiotics for tonsillitis and ENT follow-up. Dr. Dasie to assume care at shift change.        Final diagnoses:  Tonsillitis    ED Discharge Orders     None          Jacobie Stamey, Garnette, MD 08/20/23 [phone]

## 2023-08-20 NOTE — Discharge Instructions (Addendum)
 Dr. Carita wanted you to be on a course of antibiotics due to being the safe side.

## 2023-08-20 NOTE — ED Notes (Signed)
 Pt alert and oriented X 4 at the time of discharge. RR even and unlabored. No acute distress noted. Pt verbalized understanding of discharge instructions as discussed. Pt ambulatory to lobby at time of discharge.

## 2023-08-20 NOTE — ED Provider Notes (Signed)
 Patient signed to me by Dr. Carita for pending results of CT scan of neck which did not show any evidence of peritonsillar abscess.  Patient's labs here are reassuring including Monospot, strep test, viral panel.  Will discharge home with course of steroids   Marcus Faden, MD 08/20/23 [phone]
